# Patient Record
Sex: Male | Born: 1970 | Race: White | Hispanic: No | Marital: Married | Smoking: Never smoker
Health system: Southern US, Community
[De-identification: ages and names within clinical notes are randomized; demographics above are authoritative.]

## PROBLEM LIST (undated history)

## (undated) DIAGNOSIS — I10 Essential (primary) hypertension: Secondary | ICD-10-CM

## (undated) DIAGNOSIS — G4730 Sleep apnea, unspecified: Secondary | ICD-10-CM

## (undated) HISTORY — PX: APPENDECTOMY: SHX54

## (undated) HISTORY — PX: CARDIAC CATHETERIZATION: SHX172

## (undated) HISTORY — PX: KNEE SURGERY: SHX244

---

## 2004-05-18 ENCOUNTER — Emergency Department: Payer: Self-pay | Admitting: Emergency Medicine

## 2004-05-27 ENCOUNTER — Ambulatory Visit: Payer: Self-pay

## 2004-07-06 ENCOUNTER — Encounter: Payer: Self-pay | Admitting: General Practice

## 2004-07-23 ENCOUNTER — Encounter: Payer: Self-pay | Admitting: General Practice

## 2004-08-04 ENCOUNTER — Ambulatory Visit: Payer: Self-pay | Admitting: Orthopaedic Surgery

## 2008-07-15 ENCOUNTER — Emergency Department: Payer: Self-pay | Admitting: Emergency Medicine

## 2008-07-17 ENCOUNTER — Ambulatory Visit: Payer: Self-pay | Admitting: Internal Medicine

## 2009-10-26 ENCOUNTER — Ambulatory Visit: Payer: Self-pay | Admitting: Cardiology

## 2009-11-22 ENCOUNTER — Ambulatory Visit: Payer: Self-pay | Admitting: Cardiology

## 2015-04-26 ENCOUNTER — Encounter: Payer: Self-pay | Admitting: Emergency Medicine

## 2015-04-26 ENCOUNTER — Emergency Department: Payer: BC Managed Care – PPO

## 2015-04-26 ENCOUNTER — Observation Stay
Admission: EM | Admit: 2015-04-26 | Discharge: 2015-04-27 | Disposition: A | Discharge status: Home / Self Care | Payer: BC Managed Care – PPO | Attending: Internal Medicine | Admitting: Internal Medicine

## 2015-04-26 DIAGNOSIS — Z6836 Body mass index (BMI) 36.0-36.9, adult: Secondary | ICD-10-CM | POA: Insufficient documentation

## 2015-04-26 DIAGNOSIS — Z7982 Long term (current) use of aspirin: Secondary | ICD-10-CM | POA: Insufficient documentation

## 2015-04-26 DIAGNOSIS — R0602 Shortness of breath: Secondary | ICD-10-CM | POA: Insufficient documentation

## 2015-04-26 DIAGNOSIS — I10 Essential (primary) hypertension: Secondary | ICD-10-CM | POA: Diagnosis not present

## 2015-04-26 DIAGNOSIS — R079 Chest pain, unspecified: Principal | ICD-10-CM | POA: Diagnosis present

## 2015-04-26 DIAGNOSIS — Z9889 Other specified postprocedural states: Secondary | ICD-10-CM | POA: Diagnosis not present

## 2015-04-26 DIAGNOSIS — R112 Nausea with vomiting, unspecified: Secondary | ICD-10-CM | POA: Diagnosis not present

## 2015-04-26 HISTORY — DX: Essential (primary) hypertension: I10

## 2015-04-26 LAB — BASIC METABOLIC PANEL
ANION GAP: 10 (ref 5–15)
BUN: 17 mg/dL (ref 6–20)
CALCIUM: 9.9 mg/dL (ref 8.9–10.3)
CO2: 27 mmol/L (ref 22–32)
Chloride: 102 mmol/L (ref 101–111)
Creatinine, Ser: 1.08 mg/dL (ref 0.61–1.24)
GFR calc Af Amer: >60 mL/min (ref >60)
GFR calc non Af Amer: >60 mL/min (ref >60)
GLUCOSE: 100 mg/dL — AB (ref 65–99)
Potassium: 4.3 mmol/L (ref 3.5–5.1)
Sodium: 139 mmol/L (ref 135–145)

## 2015-04-26 LAB — CBC
HCT: 45.2 % (ref 40.0–52.0)
HEMOGLOBIN: 15.3 g/dL (ref 13.0–18.0)
MCH: 29.4 pg (ref 26.0–34.0)
MCHC: 33.7 g/dL (ref 32.0–36.0)
MCV: 87.3 fL (ref 80.0–100.0)
Platelets: 239 10*3/uL (ref 150–440)
RBC: 5.18 MIL/uL (ref 4.40–5.90)
RDW: 13.7 % (ref 11.5–14.5)
WBC: 12.3 10*3/uL — ABNORMAL HIGH (ref 3.8–10.6)

## 2015-04-26 LAB — TROPONIN I
Troponin I: <0.03 ng/mL (ref <0.031)
Troponin I: <0.03 ng/mL (ref <0.031)

## 2015-04-26 MED ORDER — LISINOPRIL 10 MG PO TABS
10.0000 mg | ORAL_TABLET | Freq: Every day | ORAL | Status: DC
  Administered 2015-04-27: 10 mg via ORAL
  Filled 2015-04-26: qty 1

## 2015-04-26 MED ORDER — METOPROLOL TARTRATE 25 MG PO TABS
25.0000 mg | ORAL_TABLET | Freq: Two times a day (BID) | ORAL | Status: DC
  Administered 2015-04-26 – 2015-04-27 (×2): 25 mg via ORAL
  Filled 2015-04-26 (×2): qty 1

## 2015-04-26 MED ORDER — ACETAMINOPHEN 650 MG RE SUPP: 650.0000 mg | Freq: Four times a day (QID) | RECTAL | Status: DC | PRN

## 2015-04-26 MED ORDER — ENOXAPARIN SODIUM 40 MG/0.4ML ~~LOC~~ SOLN
40.0000 mg | SUBCUTANEOUS | Status: DC
  Administered 2015-04-26: 40 mg via SUBCUTANEOUS
  Filled 2015-04-26 (×2): qty 0.4

## 2015-04-26 MED ORDER — NITROGLYCERIN 2 % TD OINT
0.5000 [in_us] | TOPICAL_OINTMENT | TRANSDERMAL | Status: AC
  Administered 2015-04-26: 0.5 [in_us] via TOPICAL
  Filled 2015-04-26: qty 1

## 2015-04-26 MED ORDER — SODIUM CHLORIDE 0.9 % IJ SOLN: 3.0000 mL | INTRAMUSCULAR | Status: DC | PRN

## 2015-04-26 MED ORDER — SODIUM CHLORIDE 0.9 % IJ SOLN
3.0000 mL | Freq: Two times a day (BID) | INTRAMUSCULAR | Status: DC
  Administered 2015-04-26: 3 mL via INTRAVENOUS

## 2015-04-26 MED ORDER — ONDANSETRON HCL 4 MG PO TABS: 4.0000 mg | ORAL_TABLET | Freq: Four times a day (QID) | ORAL | Status: DC | PRN

## 2015-04-26 MED ORDER — ONDANSETRON HCL 4 MG/2ML IJ SOLN: 4.0000 mg | Freq: Four times a day (QID) | INTRAMUSCULAR | Status: DC | PRN

## 2015-04-26 MED ORDER — MORPHINE SULFATE (PF) 2 MG/ML IV SOLN: 1.0000 mg | INTRAVENOUS | Status: DC | PRN

## 2015-04-26 MED ORDER — ASPIRIN 81 MG PO CHEW
81.0000 mg | CHEWABLE_TABLET | Freq: Every day | ORAL | Status: DC
  Administered 2015-04-27: 81 mg via ORAL
  Filled 2015-04-26: qty 1

## 2015-04-26 MED ORDER — ACETAMINOPHEN 500 MG PO TABS
1000.0000 mg | ORAL_TABLET | ORAL | Status: AC
  Administered 2015-04-26: 1000 mg via ORAL
  Filled 2015-04-26: qty 2

## 2015-04-26 MED ORDER — ACETAMINOPHEN 325 MG PO TABS
650.0000 mg | ORAL_TABLET | Freq: Four times a day (QID) | ORAL | Status: DC | PRN
  Administered 2015-04-26 (×2): 650 mg via ORAL
  Filled 2015-04-26 (×2): qty 2

## 2015-04-26 MED ORDER — SODIUM CHLORIDE 0.9 % IV SOLN: 250.0000 mL | INTRAVENOUS | Status: DC | PRN

## 2015-04-26 NOTE — ED Notes (Addendum)
Pt arrived via EMS for complaints of chest pain. Pt rates pain 6/10. Pt states pain starts at right nipple and radiates to left side. Pt reports nausea and vomiting. EMS administered ASA 324 mg and nitro x1 SL. EMS 145/102, sinus tach 112, CBG 106. Pt alert, oriented, NAD noted.

## 2015-04-26 NOTE — ED Provider Notes (Signed)
[facility] Emergency Department Provider Note REMINDER - THIS NOTE IS NOT A FINAL MEDICAL RECORD UNTIL IT IS SIGNED. UNTIL THEN, THE CONTENT BELOW MAY REFLECT INFORMATION FROM A DOCUMENTATION TEMPLATE, NOT THE ACTUAL PATIENT VISIT. ____________________________________________  Time seen: Approximately 1:11 PM  I have reviewed the triage vital signs and the nursing notes.   HISTORY  Chief Complaint Chest Pain    HPI Nathan Hayes is a 45 y.o. male the previous history of coronary disease, presents for evaluation of chest pain as discharged prior to arrival. Reports he was driving the car when he had sudden onset of severe pressure in his chest with associated nausea and sweating. The central and folate is tightening. He received aspirin and nitroglycerin and reports his symptoms went away. He just relates a very minimal discomfort in the mid chest at this time.  Denies abdominal pain, shortness of breath or wheezing. Pain was not moving, ripping or tearing in nature.   Past Medical History  Diagnosis Date  . Hypertension     There are no active problems to display for this patient.   Past Surgical History  Procedure Laterality Date  . Cardiac catheterization      No current outpatient prescriptions on file. Strong family history with dad having a major heart attack in his 30s Allergies Review of patient's allergies indicates no known allergies.  No family history on file.  Social History Social History  Substance Use Topics  . Smoking status: Never Smoker   . Smokeless tobacco: Never Used  . Alcohol Use: Yes     Comment: occasional    Review of Systems Constitutional: No fever/chills Eyes: No visual changes. ENT: No sore throat. Cardiovascular: See history of present illness  Respiratory: Denies shortness of breath. Gastrointestinal: No abdominal pain.  no vomiting.  No diarrhea.  No constipation. Genitourinary: Negative for  dysuria. Musculoskeletal: Negative for back pain. Skin: Negative for rash. Neurological: Negative for headaches, focal weakness or numbness.  10-point ROS otherwise negative.  ____________________________________________   PHYSICAL EXAM:  VITAL SIGNS: ED Triage Vitals  Enc Vitals Group     BP 04/26/15 1138 135/86 mmHg     Pulse Rate 04/26/15 1200 80     Resp 04/26/15 1138 13     Temp 04/26/15 1138 98 F (36.7 C)     Temp Source 04/26/15 1138 Oral     SpO2 04/26/15 1138 96 %     Weight 04/26/15 1138 250 lb (113.399 kg)     Height 04/26/15 1138 6' (1.829 m)     Head Cir --      Peak Flow --      Pain Score 04/26/15 1136 3     Pain Loc --      Pain Edu? --      Excl. in GC? --    Constitutional: Alert and oriented. Well appearing and in no acute distress. Eyes: Conjunctivae are normal. PERRL. EOMI. Head: Atraumatic. Nose: No congestion/rhinnorhea. Mouth/Throat: Mucous membranes are moist.  Oropharynx non-erythematous. Neck: No stridor.   Cardiovascular: Normal rate, regular rhythm. Grossly normal heart sounds.  Good peripheral circulation. Respiratory: Normal respiratory effort.  No retractions. Lungs CTAB. Gastrointestinal: Soft and nontender. No distention. No abdominal bruits. No CVA tenderness. Musculoskeletal: No lower extremity tenderness nor edema.  No joint effusions. Neurologic:  Normal speech and language. No gross focal neurologic deficits are appreciated. Skin:  Skin is warm, dry and intact. No rash noted. Psychiatric: Mood and affect are normal. Speech and  behavior are normal.  ____________________________________________   LABS (all labs ordered are listed, but only abnormal results are displayed)  Labs Reviewed  BASIC METABOLIC PANEL - Abnormal; Notable for the following:    Glucose, Bld 100 (*)    All other components within normal limits  CBC - Abnormal; Notable for the following:    WBC 12.3 (*)    All other components within normal limits   TROPONIN I   ____________________________________________  EKG  Reviewed and interpreted me me at 11:40 AM Normal sinus rhythm, QTC 4:30 QRS 90 Heart rate 90 Minimal T-wave depressions less than 1 mm in anteroseptal distribution. Concern for the possibility of ischemic abnormality. No evidence of STEMI  ____________________________________________  RADIOLOGY  DG Chest 2 View (Final result) Result time: 04/26/15 12:16:54   Final result by Rad Results In Interface (04/26/15 12:16:54)   Narrative:   CLINICAL DATA: Chest pain starts of right nipple and radiates to left.  EXAM: CHEST 2 VIEW  COMPARISON: None.  FINDINGS: The heart size and mediastinal contours are within normal limits. Both lungs are clear. The visualized skeletal structures are unremarkable.  IMPRESSION: No active cardiopulmonary disease.   Electronically Signed By: Kennith CenterEric Mansell M.D. On: 04/26/2015 12:16    ____________________________________________   PROCEDURES  Procedure(s) performed: None  Critical Care performed: No  ____________________________________________   INITIAL IMPRESSION / ASSESSMENT AND PLAN / ED COURSE  Pertinent labs & imaging results that were available during my care of the patient were reviewed by me and considered in my medical decision making (see chart for details).  Chest pain. No risk factors for pulmonary most. Denies dyspnea. Hypoxia. Concerning for possible coronary disease, pain now relieved after aspirin and nitroglycerin. EKG is abnormal but without ST elevation. Discussed with cardiology Dr. Lady GaryFath who recommends admission and further evaluation for chest pain rule out. No abdominal pain or symptoms. No ripping tearing or moving pain to suggest dissection. Mediastinum normal. ____________________________________________   FINAL CLINICAL IMPRESSION(S) / ED DIAGNOSES  Final diagnoses:  Chest pain with moderate risk for cardiac etiology       Sharyn CreamerMark Quale, MD 04/26/15 1314

## 2015-04-26 NOTE — Progress Notes (Signed)
Nathan Hayes is a 45 y.o. male patient admitted from ED awake, alert - oriented  X 4 - no acute distress noted.  VSS - Blood pressure 140/79, pulse 73, temperature 97.9 F (36.6 C), temperature source Oral, resp. rate 18, height 6' (1.829 m), weight 120.657 kg (266 lb), SpO2 99 %.    IV in place, occlusive dsg intact without redness.  Orientation to room, and floor completed with information packet given to patient/family.  Admission INP armband ID verified with patient/family, and in place.   SR up x 2, fall assessment complete, with patient and family able to verbalize understanding of risk associated with falls, and verbalized understanding to call nsg before up out of bed.  Call light within reach, patient able to voice, and demonstrate understanding.  Skin, clean-dry- intact without evidence of bruising, or skin tears.   No evidence of skin break down noted on exam. Skin assessed with Tanya B.      Will cont to eval and treat per MD orders.  Rudean HaskellDonnisha R Monserat Prestigiacomo, RN 04/26/2015 3:22 PM

## 2015-04-26 NOTE — H&P (Signed)
[facility] [place] [facility]   PATIENT NAME: Nathan DerWalter Hayes    MR#:  [id number]  DATE OF BIRTH:  08-06-1970  DATE OF ADMISSION:  04/26/2015  PRIMARY CARE PHYSICIAN: Dr Maryjane HurterFeldpausch  REQUESTING/REFERRING PHYSICIAN: Dr Fanny BienQuale  CHIEF COMPLAINT:  Chest pain today  HISTORY OF PRESENT ILLNESS:  Nathan DerWalter Hayes  is a 45 y.o. male with a known history of hypertension, obesity comes to the emergency room after he started having some chest discomfort radiating to the back with feelings of nausea and vomiting today at 7:30. Patient's first set of cardiac enzyme is negative. Currently he is chest pain-free. EKG does not show any acute ST elevation or depression. Given his risk factors for coronary artery disease patient is being admitted for further nausea management  PAST MEDICAL HISTORY:   Past Medical History  Diagnosis Date  . Hypertension     PAST SURGICAL HISTOIRY:   Past Surgical History  Procedure Laterality Date  . Cardiac catheterization      SOCIAL HISTORY:   Social History  Substance Use Topics  . Smoking status: Never Smoker   . Smokeless tobacco: Never Used  . Alcohol Use: Yes     Comment: occasional    FAMILY HISTORY:  No family history on file.  DRUG ALLERGIES:  No Known Allergies  REVIEW OF SYSTEMS:  Review of Systems  Constitutional: Negative for fever, chills and weight loss.  HENT: Negative for ear discharge, ear pain and nosebleeds.   Eyes: Negative for blurred vision, pain and discharge.  Respiratory: Negative for sputum production, shortness of breath, wheezing and stridor.   Cardiovascular: Positive for chest pain. Negative for palpitations, orthopnea and PND.  Gastrointestinal: Positive for nausea and vomiting. Negative for abdominal pain and diarrhea.  Genitourinary: Negative for urgency and frequency.  Musculoskeletal: Negative for back pain and joint pain.  Neurological: Negative for sensory change, speech change,  focal weakness and weakness.  Psychiatric/Behavioral: Negative for depression and hallucinations. The patient is not nervous/anxious.   All other systems reviewed and are negative.    MEDICATIONS AT HOME:   Prior to Admission medications   Medication Sig Start Date End Date Taking? Authorizing Provider  aspirin EC 81 MG tablet Take 81-162 mg by mouth daily.   Yes Historical Provider, MD  calcium carbonate (TUMS - DOSED IN MG ELEMENTAL CALCIUM) 500 MG chewable tablet Chew 1 tablet by mouth 3 (three) times daily.   Yes Historical Provider, MD  lisinopril (PRINIVIL,ZESTRIL) 10 MG tablet Take 10 mg by mouth daily.   Yes Historical Provider, MD      VITAL SIGNS:  Blood pressure 140/79, pulse 73, temperature 97.9 F (36.6 C), temperature source Oral, resp. rate 18, height 6' (1.829 m), weight 120.657 kg (266 lb), SpO2 99 %.  PHYSICAL EXAMINATION:  GENERAL:  45 y.o.-year-old patient lying in the bed with no acute distress.  EYES: Pupils equal, round, reactive to light and accommodation. No scleral icterus. Extraocular muscles intact.  HEENT: Head atraumatic, normocephalic. Oropharynx and nasopharynx clear.  NECK:  Supple, no jugular venous distention. No thyroid enlargement, no tenderness.  LUNGS: Normal breath sounds bilaterally, no wheezing, rales,rhonchi or crepitation. No use of accessory muscles of respiration.  CARDIOVASCULAR: S1, S2 normal. No murmurs, rubs, or gallops.  ABDOMEN: Soft, nontender, nondistended. Bowel sounds present. No organomegaly or mass.  EXTREMITIES: No pedal edema, cyanosis, or clubbing.  NEUROLOGIC: Cranial nerves II through XII are intact. Muscle strength 5/5 in all extremities. Sensation intact. Gait not checked.  PSYCHIATRIC: The patient is alert and oriented x 3.  SKIN: No obvious rash, lesion, or ulcer.   LABORATORY PANEL:   CBC  Recent Labs Lab 04/26/15 1145  WBC 12.3*  HGB 15.3  HCT 45.2  PLT 239    ------------------------------------------------------------------------------------------------------------------  Chemistries   Recent Labs Lab 04/26/15 1145  NA 139  K 4.3  CL 102  CO2 27  GLUCOSE 100*  BUN 17  CREATININE 1.08  CALCIUM 9.9   ------------------------------------------------------------------------------------------------------------------  Cardiac Enzymes  Recent Labs Lab 04/26/15 1145  TROPONINI <0.03   ------------------------------------------------------------------------------------------------------------------  RADIOLOGY:  Dg Chest 2 View  04/26/2015  CLINICAL DATA:  Chest pain starts of right nipple and radiates to left. EXAM: CHEST  2 VIEW COMPARISON:  None. FINDINGS: The heart size and mediastinal contours are within normal limits. Both lungs are clear. The visualized skeletal structures are unremarkable. IMPRESSION: No active cardiopulmonary disease. Electronically Signed   By: Kennith Center M.D.   On: 04/26/2015 12:16    EKG:   Normal sinus rhythm IMPRESSION AND PLAN:  Nathan Hayes  is a 45 y.o. male with a known history of hypertension, obesity comes to the emergency room after he started having some chest discomfort radiating to the back with feelings of nausea and vomiting today at 7:30.  1. Chest pain Admit to telemetry Continue aspirin, when necessary nitroglycerin, beta blockers, ACE inhibitor Cycle cardiac enzymes 3 Cardiac consultation with Dr. Carmon Ginsberg ATH  2. Hypertension Continue home medicine that is lisinopril   3. Morbid obesity Diet and exercise discussed   4. DVT prophylaxis subcutaneous Lovenox  All the records are reviewed and case discussed with ED provider. Management plans discussed with the patient, family and they are in agreement.  CODE STATUS: full  TOTAL TIME TAKING CARE OF THIS PATIENT: 45 minutes.    Meldrick Buttery M.D on 04/26/2015 at 2:43 PM  Between 7am to 6pm - Pager - [phone]  After 6pm go to  [email or web address] - password [email or web address] [facility]  [place] [facility]  Office  [phone]  CC: Primary care physician; No primary care provider on file.

## 2015-04-26 NOTE — Consult Note (Signed)
[facility] CARDIOLOGY [facility]  CARDIOLOGY CONSULT NOTE  Patient ID: Nathan Hayes MRN: [id number] DOB/AGE: 46-Nov-1972 45 y.o.  Admit date: 04/26/2015 Referring Physician Dr. Allena Katz Primary Physician   Primary Cardiologist  Dr. Lady Gary Reason for Consultation  Chest pain  HPI:  Patient is a 45 year old male was admitted  after developing midsternal chest pain with radiation to his back down his arm associated with diaphoresis and nausea.  Patient was evaluated in 2011 with left cardiac catheterization after an abnormal functional study.  This revealed normal coronaries.  He is being doing fairly well from a cardiac standpoint until today when he developed bilateral chest pain under both nipples.  It radiated into his back down his arm into his neck with associated nausea vomiting and shortness breath.  He also had some diaphoresis.  EKG reveals sinus rhythm with no ischemia.  Serum troponin thus far are normal.  Chest x-ray is unremarkable.  ROS Review of Systems - History obtained from chart review and the patient General ROS: positive for  - Chest pain, shortness of breath, diaphoresis and nausea Respiratory ROS: positive for - shortness of breath Cardiovascular ROS: positive for - chest pain Gastrointestinal ROS: no abdominal pain, change in bowel habits, or black or bloody stools Musculoskeletal ROS: negative Neurological ROS: no TIA or stroke symptoms   Past Medical History  Diagnosis Date  . Hypertension     History reviewed. No pertinent family history.  Social History   Social History  . Marital Status: Married    Spouse Name: N/A  . Number of Children: N/A  . Years of Education: N/A   Occupational History  . Not on file.   Social History Main Topics  . Smoking status: Never Smoker   . Smokeless tobacco: Never Used  . Alcohol Use: Yes     Comment: occasional  . Drug Use: Not on file  . Sexual Activity: Yes   Other Topics Concern  . Not on file    Social History Narrative  . No narrative on file    Past Surgical History  Procedure Laterality Date  . Cardiac catheterization       Prescriptions prior to admission  Medication Sig Dispense Refill Last Dose  . aspirin EC 81 MG tablet Take 81-162 mg by mouth daily.   04/26/2015 at 0545  . calcium carbonate (TUMS - DOSED IN MG ELEMENTAL CALCIUM) 500 MG chewable tablet Chew 1 tablet by mouth 3 (three) times daily.   04/26/2015 at 0545  . lisinopril (PRINIVIL,ZESTRIL) 10 MG tablet Take 10 mg by mouth daily.   04/26/2015 at 0545    Physical Exam: Blood pressure 140/79, pulse 73, temperature 97.9 F (36.6 C), temperature source Oral, resp. rate 18, height 6' (1.829 m), weight 120.657 kg (266 lb), SpO2 99 %.   General appearance: alert and cooperative Head: Normocephalic, without obvious abnormality, atraumatic Resp: clear to auscultation bilaterally Chest wall: no tenderness Cardio: regular rate and rhythm, S1, S2 normal, no murmur, click, rub or gallop GI: soft, non-tender; bowel sounds normal; no masses,  no organomegaly Extremities: extremities normal, atraumatic, no cyanosis or edema Lymph nodes: Cervical, supraclavicular, and axillary nodes normal. Labs:   Lab Results  Component Value Date   WBC 12.3* 04/26/2015   HGB 15.3 04/26/2015   HCT 45.2 04/26/2015   MCV 87.3 04/26/2015   PLT 239 04/26/2015    Recent Labs Lab 04/26/15 1145  NA 139  K 4.3  CL 102  CO2 27  BUN  17  CREATININE 1.08  CALCIUM 9.9  GLUCOSE 100*   Lab Results  Component Value Date   TROPONINI <0.03 04/26/2015      Radiology:   No acute cardiopulmonary process EKG:  Normal sinus rhythm with no ischemia  ASSESSMENT AND PLAN:   A 45 year old male with history of normal cardiac catheterization in 2011 who was admitted with midsternal and bilateral chest pain radiating into his back and neck and arm.  Pain with both typical atypical features.  EKG shows no ischemia.  He is ruled out from myocardia  infarction thus far.  Will continue to rule amount.  Should he remain without active ischemia as evidence by EKG changes or cardiac marker changes would proceed with a functional study to further evaluate possible ischemia.  Further recommendations will based on the results of this.  Would continue with current medical therapy. Signed: Dalia HeadingFATH,Ayleah Hofmeister A. MD, [facility] 04/26/2015, 8:22 PM

## 2015-04-27 ENCOUNTER — Observation Stay: Payer: BC Managed Care – PPO

## 2015-04-27 ENCOUNTER — Encounter: Payer: Self-pay | Admitting: Radiology

## 2015-04-27 LAB — NM MYOCAR MULTI W/SPECT W/WALL MOTION / EF
CHL CUP NUCLEAR SRS: 0
CHL CUP NUCLEAR SSS: 1
CHL CUP RESTING HR STRESS: 72 {beats}/min
CSEPED: 8 min
CSEPPHR: 166 {beats}/min
Estimated workload: 10.1 METS
Exercise duration (sec): 17 s
LV dias vol: 63 mL
LV sys vol: 26 mL
MPHR: 175 {beats}/min
Percent HR: 94 %
SDS: 1
TID: 0.64

## 2015-04-27 MED ORDER — TECHNETIUM TC 99M SESTAMIBI - CARDIOLITE
32.5600 | Freq: Once | INTRAVENOUS | Status: AC | PRN
  Administered 2015-04-27: 32.56 via INTRAVENOUS

## 2015-04-27 MED ORDER — METOPROLOL TARTRATE 25 MG PO TABS: 12.5000 mg | ORAL_TABLET | Freq: Two times a day (BID) | ORAL | Status: AC

## 2015-04-27 MED ORDER — TECHNETIUM TC 99M SESTAMIBI - CARDIOLITE
13.0000 | Freq: Once | INTRAVENOUS | Status: AC | PRN
  Administered 2015-04-27: 11:00:00 13.75 via INTRAVENOUS

## 2015-04-27 NOTE — Discharge Instructions (Signed)
Activity as tolerated  Diet heart healthy Follow-up with primary care physician in a week Follow-up with cardiology Dr. Lady GaryFath  On 05/11/2015 at 10:30 AM

## 2015-04-27 NOTE — Discharge Summary (Signed)
[facility] [place] [facility]   PATIENT NAME: Nathan Hayes    MR#:  [id number]  DATE OF BIRTH:  06-10-1970  DATE OF ADMISSION:  04/26/2015 ADMITTING PHYSICIAN: Enedina FinnerSona Patel, MD  DATE OF DISCHARGE: 04/27/2015 PRIMARY CARE PHYSICIAN: No primary care provider on file.    ADMISSION DIAGNOSIS:  Chest pain with moderate risk for cardiac etiology [R07.9]  DISCHARGE DIAGNOSIS:  Active Problems:   Chest pain   SECONDARY DIAGNOSIS:   Past Medical History  Diagnosis Date  . Hypertension     HOSPITAL COURSE:   Nathan DerWalter Underdown is a 45 y.o. male with a known history of hypertension, obesity comes to the emergency room after he started having some chest discomfort radiating to the back with feelings of nausea and vomiting today at 7:30.  1. Chest pain-resolved Monitor patient on telemetry Continue aspirin, when necessary nitroglycerin, beta blockers, ACE inhibitor Cycle cardiac enzymes 3 non-trending- Appreciate cardiology recommendations, patient had stress test which is normal Outpatient follow-up with cardiology on January 17 at 10:30 AM as scheduled  2. Hypertension Continue home medicine that is lisinopril . Metoprolol 12.5 mg by mouth twice a day is added to the regimen  3. Morbid obesity Diet and exercise discussed   4. DVT prophylaxis subcutaneous Lovenox  DISCHARGE CONDITIONS:   fair  CONSULTS OBTAINED:  Treatment Team:  Dalia HeadingKenneth A Fath, MD   PROCEDURES stress test-normal  DRUG ALLERGIES:  No Known Allergies  DISCHARGE MEDICATIONS:   Current Discharge Medication List    START taking these medications   Details  metoprolol tartrate (LOPRESSOR) 25 MG tablet Take 0.5 tablets (12.5 mg total) by mouth 2 (two) times daily. Qty: 30 tablet, Refills: 0      CONTINUE these medications which have NOT CHANGED   Details  aspirin EC 81 MG tablet Take 81-162 mg by mouth daily.    calcium carbonate (TUMS - DOSED IN MG ELEMENTAL CALCIUM)  500 MG chewable tablet Chew 1 tablet by mouth 3 (three) times daily.    lisinopril (PRINIVIL,ZESTRIL) 10 MG tablet Take 10 mg by mouth daily.         DISCHARGE INSTRUCTIONS:   Activity as tolerated  Diet heart healthy Follow-up with primary care physician in a week Follow-up with cardiology Dr. Lady GaryFath  On 05/11/2015 at 10:30 AM  DIET:  Heart healthy  DISCHARGE CONDITION:  Fair  ACTIVITY:  Activity as tolerated  OXYGEN:  Home Oxygen: No.   Oxygen Delivery: room air  DISCHARGE LOCATION:  home   If you experience worsening of your admission symptoms, develop shortness of breath, life threatening emergency, suicidal or homicidal thoughts you must seek medical attention immediately by calling 911 or calling your MD immediately  if symptoms less severe.  You Must read complete instructions/literature along with all the possible adverse reactions/side effects for all the Medicines you take and that have been prescribed to you. Take any new Medicines after you have completely understood and accpet all the possible adverse reactions/side effects.   Please note  You were cared for by a hospitalist during your hospital stay. If you have any questions about your discharge medications or the care you received while you were in the hospital after you are discharged, you can call the unit and asked to speak with the hospitalist on call if the hospitalist that took care of you is not available. Once you are discharged, your primary care physician will handle any further medical issues. Please note that NO REFILLS for  any discharge medications will be authorized once you are discharged, as it is imperative that you return to your primary care physician (or establish a relationship with a primary care physician if you do not have one) for your aftercare needs so that they can reassess your need for medications and monitor your lab values.     Today  Chief Complaint  Patient presents with  .  Chest Pain   Patient is feeling fine. Denies any chest pain or shortness of breath. Had stress test which was normal. Okay to be discharged from cardiology standpoint  ROS:  CONSTITUTIONAL: Denies fevers, chills. Denies any fatigue, weakness.  EYES: Denies blurry vision, double vision, eye pain. EARS, NOSE, THROAT: Denies tinnitus, ear pain, hearing loss. RESPIRATORY: Denies cough, wheeze, shortness of breath.  CARDIOVASCULAR: Denies chest pain, palpitations, edema.  GASTROINTESTINAL: Denies nausea, vomiting, diarrhea, abdominal pain. Denies bright red blood per rectum. GENITOURINARY: Denies dysuria, hematuria. ENDOCRINE: Denies nocturia or thyroid problems. HEMATOLOGIC AND LYMPHATIC: Denies easy bruising or bleeding. SKIN: Denies rash or lesion. MUSCULOSKELETAL: Denies pain in neck, back, shoulder, knees, hips or arthritic symptoms.  NEUROLOGIC: Denies paralysis, paresthesias.  PSYCHIATRIC: Denies anxiety or depressive symptoms.   VITAL SIGNS:  Blood pressure 130/65, pulse 89, temperature 98 F (36.7 C), temperature source Oral, resp. rate 18, height 6' (1.829 m), weight 120.657 kg (266 lb), SpO2 97 %.  I/O:   Intake/Output Summary (Last 24 hours) at 04/27/15 1449 Last data filed at 04/26/15 1900  Gross per 24 hour  Intake    240 ml  Output      0 ml  Net    240 ml    PHYSICAL EXAMINATION:  GENERAL:  45 y.o.-year-old patient lying in the bed with no acute distress.  EYES: Pupils equal, round, reactive to light and accommodation. No scleral icterus. Extraocular muscles intact.  HEENT: Head atraumatic, normocephalic. Oropharynx and nasopharynx clear.  NECK:  Supple, no jugular venous distention. No thyroid enlargement, no tenderness.  LUNGS: Normal breath sounds bilaterally, no wheezing, rales,rhonchi or crepitation. No use of accessory muscles of respiration.  CARDIOVASCULAR: S1, S2 normal. No murmurs, rubs, or gallops.  ABDOMEN: Soft, non-tender, non-distended. Bowel sounds  present. No organomegaly or mass.  EXTREMITIES: No pedal edema, cyanosis, or clubbing.  NEUROLOGIC: Cranial nerves II through XII are intact. Muscle strength 5/5 in all extremities. Sensation intact. Gait not checked.  PSYCHIATRIC: The patient is alert and oriented x 3.  SKIN: No obvious rash, lesion, or ulcer.   DATA REVIEW:   CBC  Recent Labs Lab 04/26/15 1145  WBC 12.3*  HGB 15.3  HCT 45.2  PLT 239    Chemistries   Recent Labs Lab 04/26/15 1145  NA 139  K 4.3  CL 102  CO2 27  GLUCOSE 100*  BUN 17  CREATININE 1.08  CALCIUM 9.9    Cardiac Enzymes  Recent Labs Lab 04/26/15 2028  TROPONINI <0.03    Microbiology Results  No results found for this or any previous visit.  RADIOLOGY:  Dg Chest 2 View  04/26/2015  CLINICAL DATA:  Chest pain starts of right nipple and radiates to left. EXAM: CHEST  2 VIEW COMPARISON:  None. FINDINGS: The heart size and mediastinal contours are within normal limits. Both lungs are clear. The visualized skeletal structures are unremarkable. IMPRESSION: No active cardiopulmonary disease. Electronically Signed   By: Kennith Center M.D.   On: 04/26/2015 12:16   Nm Myocar Multi W/spect W/wall Motion / Ef  04/27/2015  The study is normal.  This is a low risk study.  The left ventricular ejection fraction is normal (55-65%).     EKG:   Orders placed or performed during the hospital encounter of 04/26/15  . EKG 12-Lead  . EKG 12-Lead  . ED EKG within 10 minutes  . ED EKG within 10 minutes  . ED EKG  . ED EKG      Management plans discussed with the patient, family and they are in agreement.  CODE STATUS:     Code Status Orders        Start     Ordered   04/26/15 1417  Full code   Continuous     04/26/15 1416      TOTAL TIME TAKING CARE OF THIS PATIENT: 45  minutes.    @[facility] @  on 04/27/2015 at 2:49 PM  Between 7am to 6pm - Pager - [phone]  After 6pm go to [email or web address] - password [email or web address] [facility]  [place]  [facility]  Office  [phone]  CC: Primary care physician; No primary care provider on file.

## 2015-04-27 NOTE — Progress Notes (Signed)
[facility]  SUBJECTIVE: No further chest pain. Ruled out for mi.   Filed Vitals:   04/26/15 2045 04/27/15 0539 04/27/15 0823 04/27/15 1243  BP: 108/54 119/66 128/51 130/65  Pulse: 62 70 71 89  Temp: 97.6 F (36.4 C) 97.9 F (36.6 C) 97.7 F (36.5 C) 98 F (36.7 C)  TempSrc: Oral Oral Oral Oral  Resp: 16 16 18 18   Height:      Weight:      SpO2: 97% 99% 99% 97%    Intake/Output Summary (Last 24 hours) at 04/27/15 1337 Last data filed at 04/26/15 1900  Gross per 24 hour  Intake    240 ml  Output      0 ml  Net    240 ml    LABS: Basic Metabolic Panel:  Recent Labs  16/01/9600/02/17 1145  NA 139  K 4.3  CL 102  CO2 27  GLUCOSE 100*  BUN 17  CREATININE 1.08  CALCIUM 9.9   Liver Function Tests: No results for input(s): AST, ALT, ALKPHOS, BILITOT, PROT, ALBUMIN in the last 72 hours. No results for input(s): LIPASE, AMYLASE in the last 72 hours. CBC:  Recent Labs  04/26/15 1145  WBC 12.3*  HGB 15.3  HCT 45.2  MCV 87.3  PLT 239   Cardiac Enzymes:  Recent Labs  04/26/15 1145 04/26/15 1432 04/26/15 2028  TROPONINI <0.03 <0.03 <0.03   BNP: Invalid input(s): POCBNP D-Dimer: No results for input(s): DDIMER in the last 72 hours. Hemoglobin A1C: No results for input(s): HGBA1C in the last 72 hours. Fasting Lipid Panel: No results for input(s): CHOL, HDL, LDLCALC, TRIG, CHOLHDL, LDLDIRECT in the last 72 hours. Thyroid Function Tests: No results for input(s): TSH, T4TOTAL, T3FREE, THYROIDAB in the last 72 hours.  Invalid input(s): FREET3 Anemia Panel: No results for input(s): VITAMINB12, FOLATE, FERRITIN, TIBC, IRON, RETICCTPCT in the last 72 hours.   Physical Exam: Blood pressure 130/65, pulse 89, temperature 98 F (36.7 C), temperature source Oral, resp. rate 18, height 6' (1.829 m), weight 120.657 kg (266 lb), SpO2 97 %.  General appearance: alert and cooperative Resp: clear to auscultation bilaterally Cardio:  regular rate and rhythm GI: soft, non-tender; bowel sounds normal; no masses,  no organomegaly Extremities: extremities normal, atraumatic, no cyanosis or edema Neurologic: Grossly normal  TELEMETRY: Reviewed telemetry pt in nsr:  ASSESSMENT AND PLAN:  Active Problems:   Chest pain-chest pain with both typical and atypical features. No ekg changes c/w ischemia. Functional study showed normal lv function and no evidence of ischemia with excellent exercise tolerance. OK for discharge from cardiac standpoint with outpatient follow up in 1 week to discuss any further work up depending on symptoms.     Dalia HeadingFATH,Sheila Ocasio A., MD, [facility] 04/27/2015 1:37 PM

## 2016-09-18 IMAGING — CR DG CHEST 2V
2 series · 2 of 2 positions shown · non-contrast
Comparison: None.

CLINICAL DATA: Chest pain starts of right nipple and radiates to
left.

EXAM:
CHEST  2 VIEW

[chest pa]
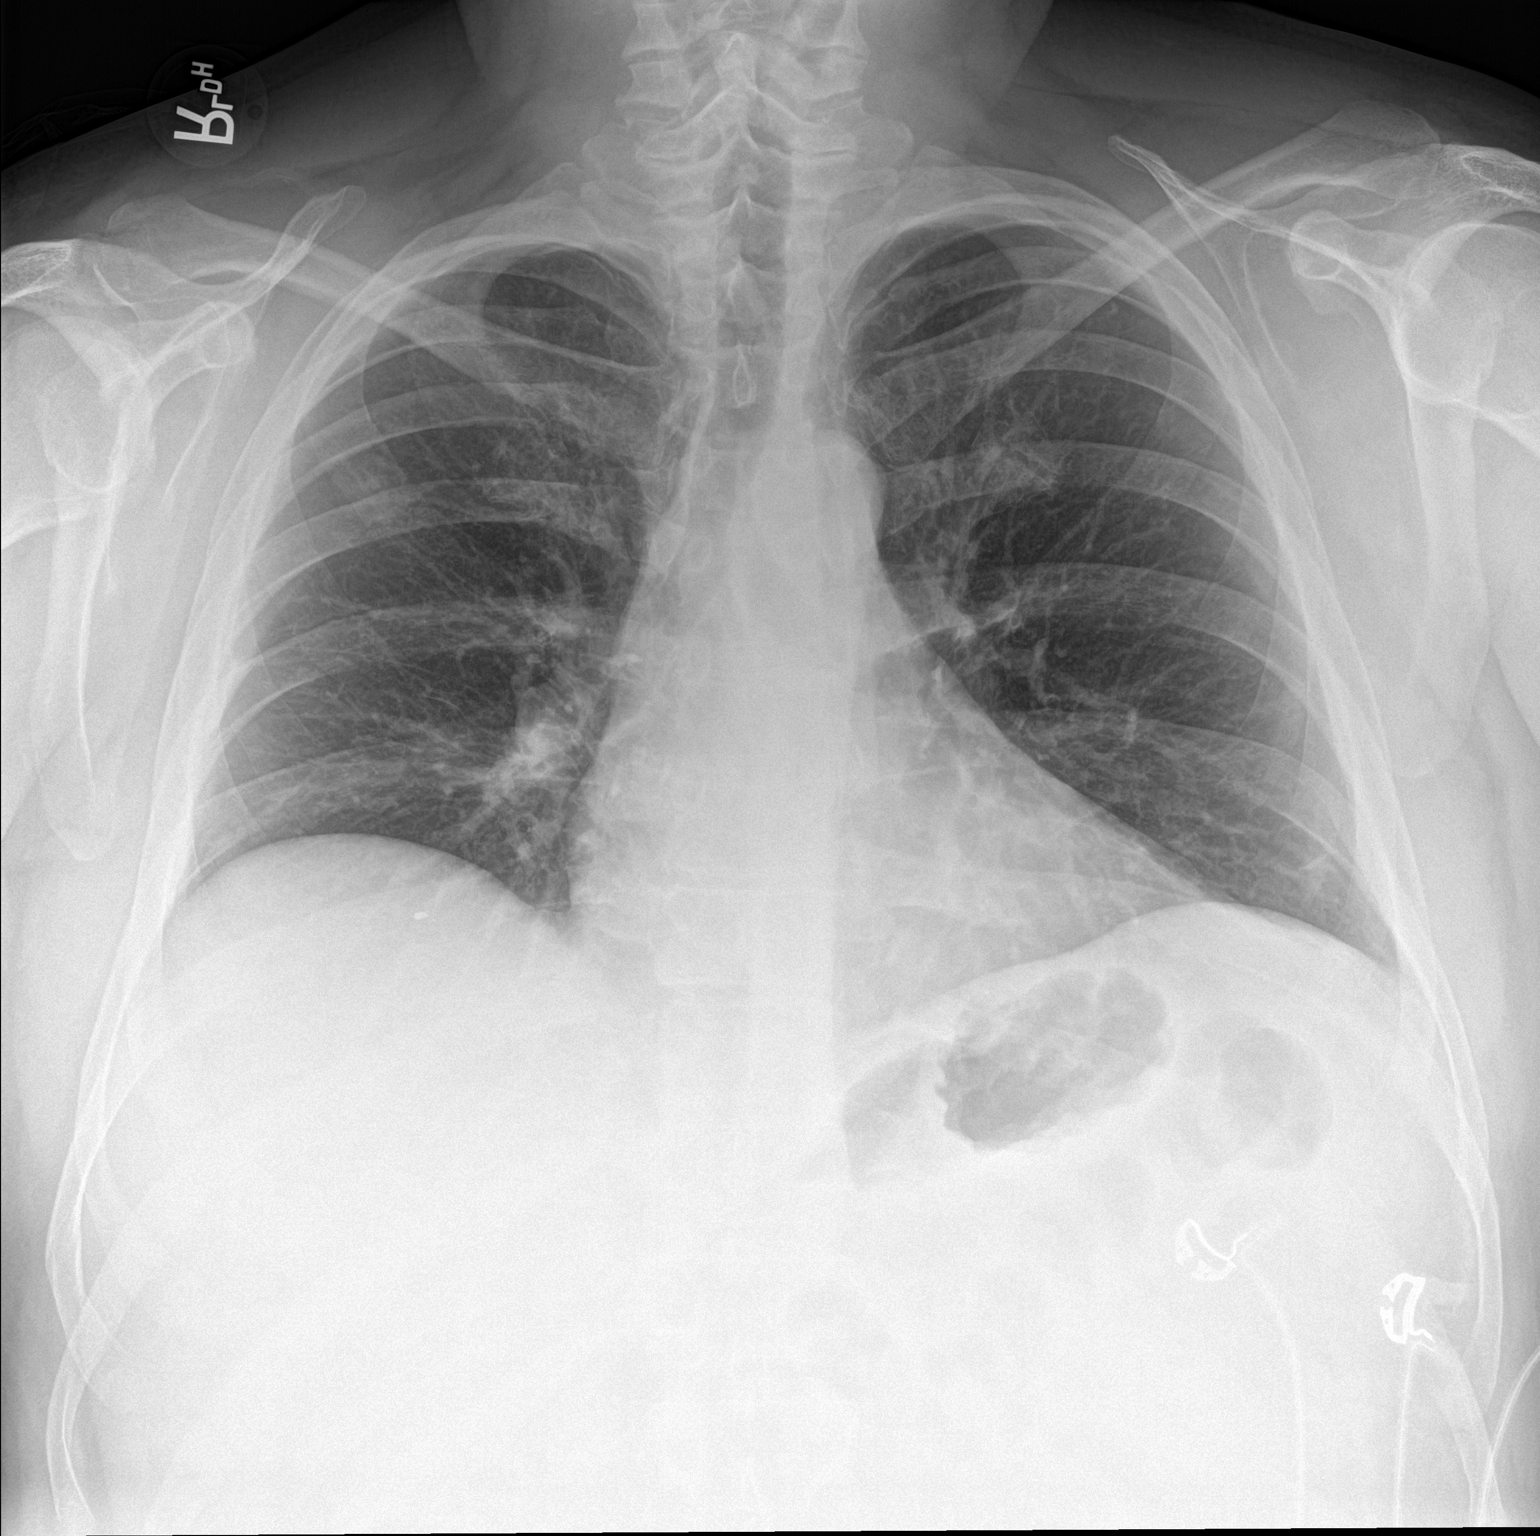

[chest lat]
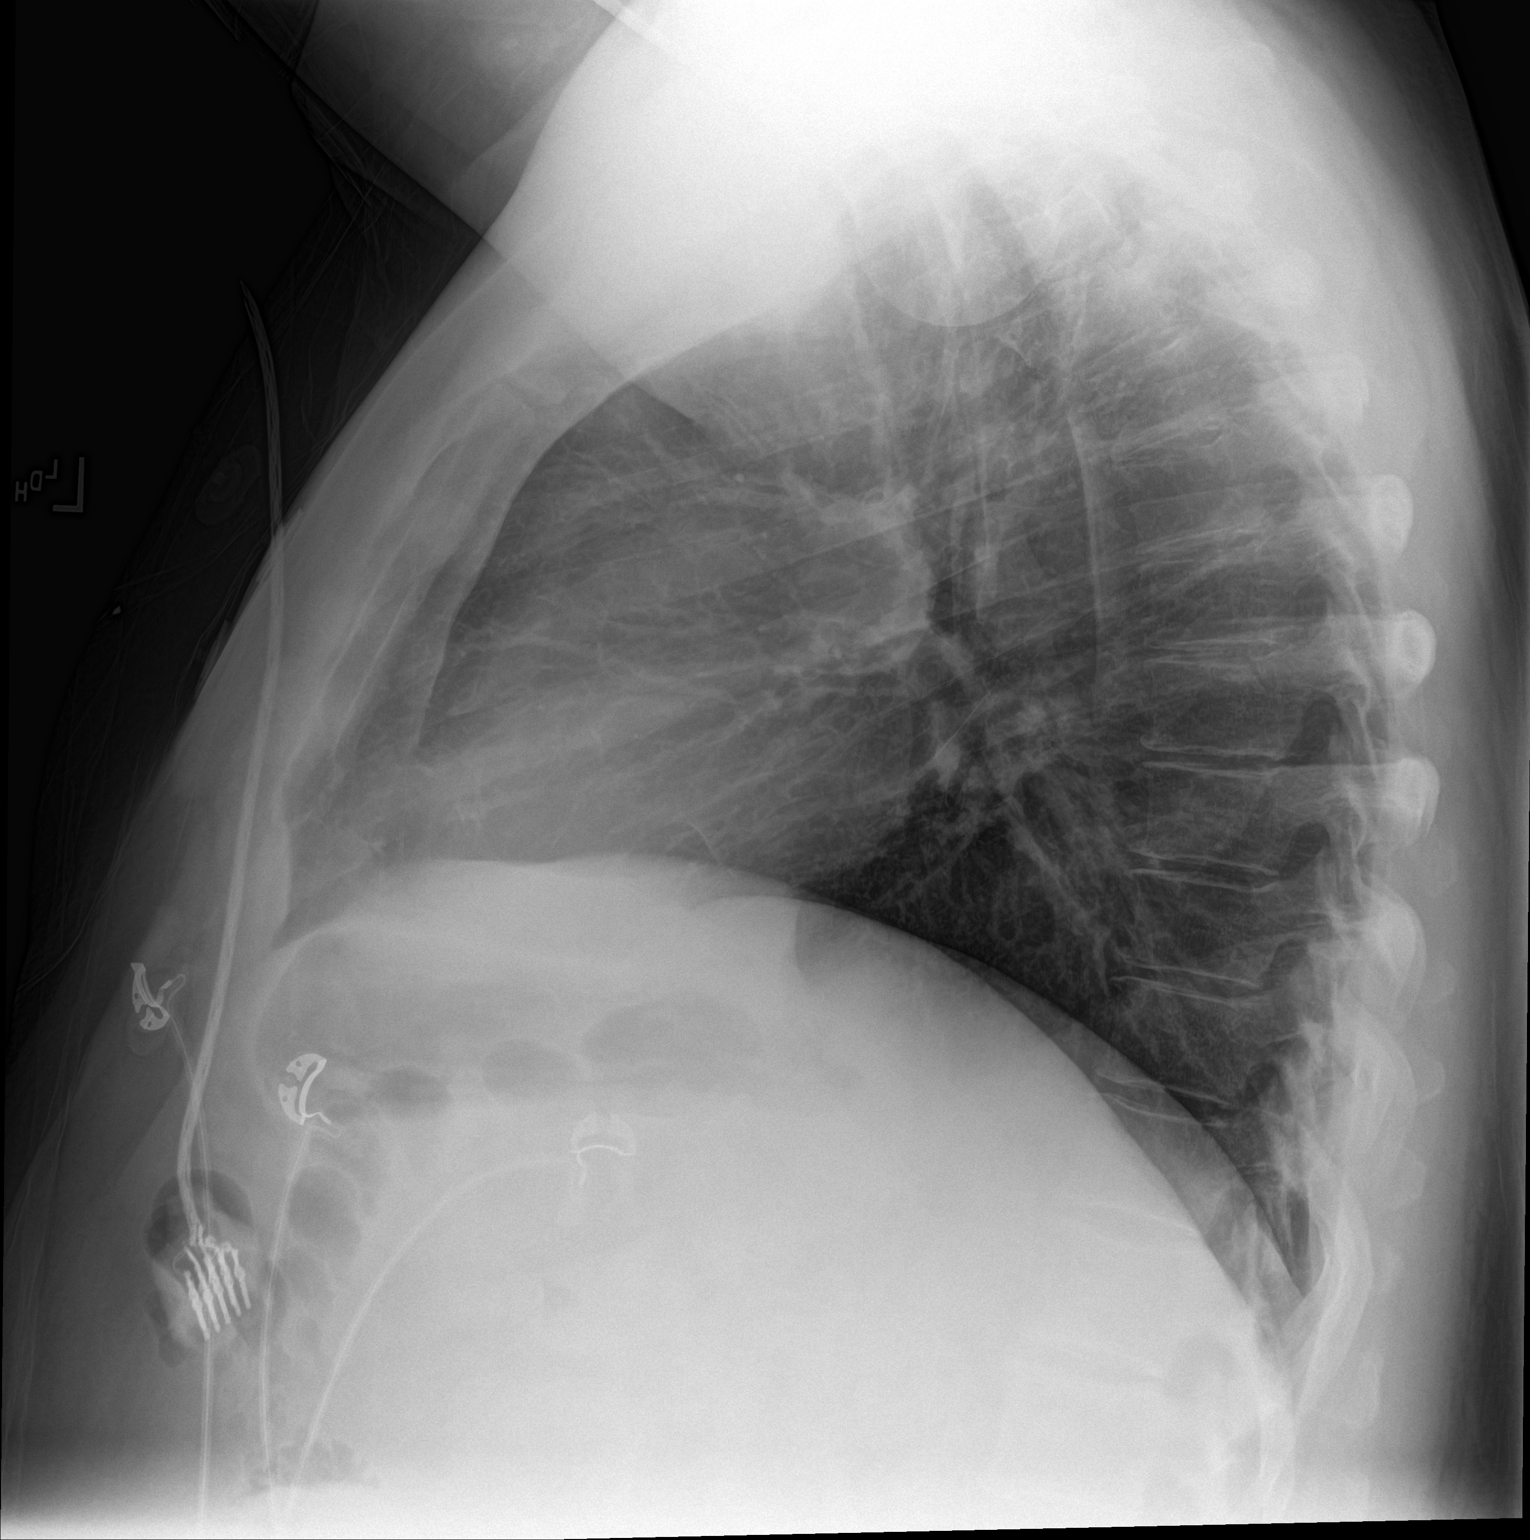

[2 of 2 positions shown; findings below may reference images not displayed]

FINDINGS: The heart size and mediastinal contours are within normal limits.
Both lungs are clear. The visualized skeletal structures are
unremarkable.
IMPRESSION: No active cardiopulmonary disease.

## 2021-03-03 ENCOUNTER — Telehealth: Payer: Self-pay | Admitting: Emergency Medicine

## 2021-03-03 ENCOUNTER — Other Ambulatory Visit: Payer: Self-pay

## 2021-03-03 ENCOUNTER — Ambulatory Visit
Admission: EM | Admit: 2021-03-03 | Discharge: 2021-03-03 | Disposition: A | Discharge status: Home / Self Care | Payer: BC Managed Care – PPO | Attending: Emergency Medicine | Admitting: Emergency Medicine

## 2021-03-03 DIAGNOSIS — H109 Unspecified conjunctivitis: Secondary | ICD-10-CM

## 2021-03-03 DIAGNOSIS — B9689 Other specified bacterial agents as the cause of diseases classified elsewhere: Secondary | ICD-10-CM | POA: Diagnosis not present

## 2021-03-03 MED ORDER — POLYMYXIN B-TRIMETHOPRIM 10000-0.1 UNIT/ML-% OP SOLN: 1.0000 [drp] | OPHTHALMIC | 0 refills | Status: AC

## 2021-03-03 NOTE — ED Triage Notes (Signed)
Pt here with C/O bilateral eye redness, discharge. Family has had pink eye

## 2021-03-03 NOTE — ED Provider Notes (Signed)
[facility]    CSN: [id number] Arrival date & time: 03/03/21  0847      History   Chief Complaint Chief Complaint  Patient presents with  . Eye Problem    HPI Nathan Hayes is a 50 y.o. male.   Patient presents with bilateral eye discharge, blurred vision and redness for 2 to 3 days.  Has attempted use of over-the-counter eyedrops with no relief.  Other members of household have [place].  History of hypertension.   Past Medical History:  Diagnosis Date  . Hypertension     Patient Active Problem List   Diagnosis Date Noted  . Chest pain 04/26/2015    Past Surgical History:  Procedure Laterality Date  . CARDIAC CATHETERIZATION    . KNEE SURGERY Right    Orthoscopic Rt knee surgery 2005-2006 approximately per pt       Home Medications    Prior to Admission medications   Medication Sig Start Date End Date Taking? Authorizing Provider  aspirin EC 81 MG tablet Take 81-162 mg by mouth daily.   Yes [provider]  calcium carbonate (TUMS - DOSED IN MG ELEMENTAL CALCIUM) 500 MG chewable tablet Chew 1 tablet by mouth 3 (three) times daily.   Yes [provider]  lisinopril (PRINIVIL,ZESTRIL) 10 MG tablet Take 10 mg by mouth daily.   Yes [provider]  metoprolol tartrate (LOPRESSOR) 25 MG tablet Take 0.5 tablets (12.5 mg total) by mouth 2 (two) times daily. 04/27/15  Yes Nicholes Mango, MD    Family History History reviewed. No pertinent family history.  Social History Social History   Tobacco Use  . Smoking status: Never  . Smokeless tobacco: Never  Substance Use Topics  . Alcohol use: Yes    Comment: occasional  . Drug use: No     Allergies   Patient has no known allergies.   Review of Systems Review of Systems  Constitutional: Negative.   Eyes:  Positive for discharge, redness and visual disturbance. Negative for photophobia, pain and itching.  Respiratory: Negative.    Cardiovascular: Negative.   Skin:  Negative.   Neurological: Negative.     Physical Exam Triage Vital Signs ED Triage Vitals  Enc Vitals Group     BP 03/03/21 0926 138/69     Pulse Rate 03/03/21 0926 79     Resp 03/03/21 0926 18     Temp 03/03/21 0926 99.7 F (37.6 C)     Temp Source 03/03/21 0926 Oral     SpO2 03/03/21 0926 96 %     Weight 03/03/21 0925 260 lb (117.9 kg)     Height 03/03/21 0925 6' (1.829 m)     Head Circumference --      Peak Flow --      Pain Score 03/03/21 0925 0     Pain Loc --      Pain Edu? --      Excl. in [place]? --    No data found.  Updated Vital Signs BP 138/69 (BP Location: Left Arm)   Pulse 79   Temp 99.7 F (37.6 C) (Oral)   Resp 18   Ht 6' (1.829 m)   Wt 260 lb (117.9 kg)   SpO2 96%   BMI 35.26 kg/m   Visual Acuity Right Eye Distance:   Left Eye Distance:   Bilateral Distance:    Right Eye Near:   Left Eye Near:    Bilateral Near:     Physical Exam Constitutional:  Appearance: Normal appearance. He is normal weight.  HENT:     Head: Normocephalic.  Eyes:     Comments: Dried drainage on the bilateral eyelashes of the upper and lower lid, diffuse erythema throughout the bilateral sclera, extraocular movement is intact, visual acuity intact  Neurological:     Mental Status: He is alert.     UC Treatments / Results  Labs (all labs ordered are listed, but only abnormal results are displayed) Labs Reviewed - No data to display  EKG   Radiology No results found.  Procedures Procedures (including critical care time)  Medications Ordered in UC Medications - No data to display  Initial Impression / Assessment and Plan / UC Course  I have reviewed the triage vital signs and the nursing notes.  Pertinent labs & imaging results that were available during my care of the patient were reviewed by me and considered in my medical decision making (see chart for details).  Bilateral bacterial conjunctivitis  1.  Polytrim 1 drop each eye every 4 hours for 7  days 2.  Cool compresses for comfort and to remove drainage 3.  Over-the-counter antihistamine as needed for itching 4.  Follow-up with urgent care for ophthalmology for persistent or reoccurring symptoms Final Clinical Impressions(s) / UC Diagnoses   Final diagnoses:  None   Discharge Instructions   None    ED Prescriptions   None    PDMP not reviewed this encounter.   Valinda Hoar, NP 03/03/21 1319

## 2021-03-03 NOTE — Discharge Instructions (Signed)
Today you being treated for bacterial conjunctivitis.   Place one drop of polytrim into the effected eye every 4 hours while awake for 7 days. If the other eye starts to have symptoms you may use medication in it as well. Do not allow tip of dropper to touch eye. May use cool compress for comfort and to remove discharge if present. Pat the eye, do not wipe.  If wearing contacts, dispose of current pair. Wear glasses until symptoms have resolved.   Do not rub eyes, this may cause more irritation.  May use zyrtec, Claritin or benadryl as needed to help if itching present.  If symptoms persist after use of medication, please follow up at Urgent Care or with ophthalmologist (eye doctor)

## 2022-12-19 ENCOUNTER — Emergency Department
Admission: EM | Admit: 2022-12-19 | Discharge: 2022-12-19 | Disposition: A | Discharge status: Home / Self Care | Payer: 59 | Attending: Emergency Medicine | Admitting: Emergency Medicine

## 2022-12-19 ENCOUNTER — Other Ambulatory Visit: Payer: Self-pay

## 2022-12-19 DIAGNOSIS — W57XXXA Bitten or stung by nonvenomous insect and other nonvenomous arthropods, initial encounter: Secondary | ICD-10-CM

## 2022-12-19 DIAGNOSIS — T7840XA Allergy, unspecified, initial encounter: Secondary | ICD-10-CM | POA: Diagnosis not present

## 2022-12-19 DIAGNOSIS — R079 Chest pain, unspecified: Secondary | ICD-10-CM | POA: Diagnosis not present

## 2022-12-19 DIAGNOSIS — S50862A Insect bite (nonvenomous) of left forearm, initial encounter: Secondary | ICD-10-CM | POA: Diagnosis not present

## 2022-12-19 DIAGNOSIS — S59912A Unspecified injury of left forearm, initial encounter: Secondary | ICD-10-CM | POA: Diagnosis present

## 2022-12-19 DIAGNOSIS — L509 Urticaria, unspecified: Secondary | ICD-10-CM

## 2022-12-19 LAB — BASIC METABOLIC PANEL
Anion gap: 13 (ref 5–15)
BUN: 23 mg/dL — ABNORMAL HIGH (ref 6–20)
CO2: 20 mmol/L — ABNORMAL LOW (ref 22–32)
Calcium: 9.4 mg/dL (ref 8.9–10.3)
Chloride: 103 mmol/L (ref 98–111)
Creatinine, Ser: 1.06 mg/dL (ref 0.61–1.24)
GFR, Estimated: >60 mL/min (ref >60)
Glucose, Bld: 114 mg/dL — ABNORMAL HIGH (ref 70–99)
Potassium: 3.8 mmol/L (ref 3.5–5.1)
Sodium: 136 mmol/L (ref 135–145)

## 2022-12-19 LAB — CBC
HCT: 46.2 % (ref 39.0–52.0)
Hemoglobin: 16 g/dL (ref 13.0–17.0)
MCH: 30.1 pg (ref 26.0–34.0)
MCHC: 34.6 g/dL (ref 30.0–36.0)
MCV: 86.8 fL (ref 80.0–100.0)
Platelets: 285 10*3/uL (ref 150–400)
RBC: 5.32 MIL/uL (ref 4.22–5.81)
RDW: 13.2 % (ref 11.5–15.5)
WBC: 8.9 10*3/uL (ref 4.0–10.5)
nRBC: 0 % (ref 0.0–0.2)

## 2022-12-19 LAB — TROPONIN I (HIGH SENSITIVITY): Troponin I (High Sensitivity): 4 ng/L (ref <18)

## 2022-12-19 MED ORDER — PREDNISONE 50 MG PO TABS: 50.0000 mg | ORAL_TABLET | Freq: Every day | ORAL | 0 refills | Status: AC

## 2022-12-19 MED ORDER — EPINEPHRINE 0.3 MG/0.3ML IJ SOAJ: 0.3000 mg | INTRAMUSCULAR | 1 refills | Status: AC | PRN

## 2022-12-19 MED ORDER — PREDNISONE 20 MG PO TABS
60.0000 mg | ORAL_TABLET | Freq: Once | ORAL | Status: AC
  Administered 2022-12-19: 60 mg via ORAL
  Filled 2022-12-19: qty 3

## 2022-12-19 NOTE — Discharge Instructions (Addendum)
Please use Benadryl 25-50 mg every 8 hours for the next 3 days

## 2022-12-19 NOTE — ED Provider Notes (Signed)
[facility] Provider Note   Event Date/Time   First MD Initiated Contact with Patient 12/19/22 1106     (approximate) History  Insect Bite and Chest Pain  HPI Nathan Hayes is a 52 y.o. male who presents after multiple fire ant bites to the left forearm complaining of hives, chest pain, and shortness of breath that have since resolved after taking 50 mg of Benadryl prior to arrival.  Patient denies any known history of significant ant allergies.  Patient currently denies any shortness of breath, difficulty swallowing or breathing.  Patient does endorse continued rash over bilateral upper lower extremities as well as across the stomach ROS: Patient currently denies any vision changes, tinnitus, difficulty speaking, facial droop, sore throat, chest pain, shortness of breath, abdominal pain, nausea/vomiting/diarrhea, dysuria, or weakness/numbness/paresthesias in any extremity   Physical Exam  Triage Vital Signs: ED Triage Vitals  Encounter Vitals Group     BP 12/19/22 1020 (!) 140/72     Systolic BP Percentile --      Diastolic BP Percentile --      Pulse Rate 12/19/22 1020 93     Resp 12/19/22 1018 18     Temp 12/19/22 1020 97.7 F (36.5 C)     Temp src --      SpO2 12/19/22 1020 98 %     Weight 12/19/22 1019 280 lb (127 kg)     Height 12/19/22 1019 6' (1.829 m)     Head Circumference --      Peak Flow --      Pain Score --      Pain Loc --      Pain Education --      Exclude from Growth Chart --    Most recent vital signs: Vitals:   12/19/22 1018 12/19/22 1020  BP:  (!) 140/72  Pulse:  93  Resp: 18   Temp:  97.7 F (36.5 C)  SpO2:  98%   General: Awake, oriented x4. CV:  Good peripheral perfusion.  Resp:  Normal effort.  Abd:  No distention.  Other:  Middle-aged obese Caucasian male resting comfortably in no acute distress ED Results / Procedures / Treatments  Labs (all labs ordered are listed, but only abnormal results are displayed) Labs  Reviewed  BASIC METABOLIC PANEL - Abnormal; Notable for the following components:      Result Value   CO2 20 (*)    Glucose, Bld 114 (*)    BUN 23 (*)    All other components within normal limits  CBC  TROPONIN I (HIGH SENSITIVITY)   EKG ED ECG REPORT I, Merwyn Katos, the attending physician, personally viewed and interpreted this ECG. Date: 12/19/2022 EKG Time: 1023 Rate: 83 Rhythm: normal sinus rhythm QRS Axis: normal Intervals: normal ST/T Wave abnormalities: normal Narrative Interpretation: no evidence of acute ischemia PROCEDURES: Critical Care performed: No Procedures MEDICATIONS ORDERED IN ED: Medications  predniSONE (DELTASONE) tablet 60 mg (has no administration in time range)   IMPRESSION / MDM / ASSESSMENT AND PLAN / ED COURSE  I reviewed the triage vital signs and the nursing notes.                             The patient is on the cardiac monitor to evaluate for evidence of arrhythmia and/or significant heart rate changes. Patient's presentation is most consistent with acute presentation with potential threat to life or bodily function. +  Cutaneous hives/erythema No evidence of multiorgan involvement  Given history and exam, presentation most consistent with allergic reaction. I have low suspicion for toxic shock syndrome, anaphylaxis, asthma exacerbation, or drug toxicity. Rx: Prednisone 50mg  qday x3days, Benadryl 25mg  q8hr x3days Disposition: Discharge home with SRP. Follow up with PCP in 1-2 days.   FINAL CLINICAL IMPRESSION(S) / ED DIAGNOSES   Final diagnoses:  Insect bite of left forearm, initial encounter  Urticaria  Allergic reaction, initial encounter   Rx / DC Orders   ED Discharge Orders          Ordered    predniSONE (DELTASONE) 50 MG tablet  Daily with breakfast        12/19/22 1117    EPINEPHrine (EPIPEN 2-PAK) 0.3 mg/0.3 mL IJ SOAJ injection  As needed        12/19/22 1118           Note:  This document was prepared using  Dragon voice recognition software and may include unintentional dictation errors.   Merwyn Katos, MD 12/19/22 [id number]

## 2022-12-19 NOTE — ED Triage Notes (Signed)
Pt to ED for fire ant bites today to arms and legs. Took 50mg  benadryl PTA.  Reports was having chest pain, has since subsided

## 2023-07-12 ENCOUNTER — Ambulatory Visit (INDEPENDENT_AMBULATORY_CARE_PROVIDER_SITE_OTHER)

## 2023-07-12 ENCOUNTER — Ambulatory Visit: Admission: EM | Admit: 2023-07-12 | Discharge: 2023-07-12 | Disposition: A | Discharge status: Home / Self Care

## 2023-07-12 DIAGNOSIS — R5383 Other fatigue: Secondary | ICD-10-CM | POA: Diagnosis not present

## 2023-07-12 DIAGNOSIS — Z2089 Contact with and (suspected) exposure to other communicable diseases: Secondary | ICD-10-CM

## 2023-07-12 DIAGNOSIS — R058 Other specified cough: Secondary | ICD-10-CM | POA: Diagnosis not present

## 2023-07-12 DIAGNOSIS — B349 Viral infection, unspecified: Secondary | ICD-10-CM

## 2023-07-12 MED ORDER — IPRATROPIUM-ALBUTEROL 0.5-2.5 (3) MG/3ML IN SOLN: 3.0000 mL | Freq: Once | RESPIRATORY_TRACT | Status: DC

## 2023-07-12 MED ORDER — PROMETHAZINE-DM 6.25-15 MG/5ML PO SYRP: 5.0000 mL | ORAL_SOLUTION | Freq: Four times a day (QID) | ORAL | 0 refills | Status: AC | PRN

## 2023-07-12 MED ORDER — IPRATROPIUM BROMIDE 0.06 % NA SOLN: 2.0000 | Freq: Four times a day (QID) | NASAL | 0 refills | Status: AC

## 2023-07-12 NOTE — ED Provider Notes (Addendum)
 [facility]    CSN: [id number] Arrival date & time: 07/12/23  4098      History   Chief Complaint Chief Complaint  Patient presents with   Cough   Ear Fullness   Nasal Congestion    HPI Nathan Hayes is a 53 y.o. male presenting for 6-day history of cough productive of brownish-green sputum, fatigue, bilateral ear pressure/fullness, nasal congestion.  Denies fever, body aches, sinus pain, sore throat, chest pain, wheezing, shortness of breath.  Reports that his wife was seen last week and received antibiotics for pneumonia.  He believes he might have pneumonia as well.  He has been taking over-the-counter medication but says he thinks he needs something stronger.  No history of COPD or asthma and patient is a non-smoker.  HPI  Past Medical History:  Diagnosis Date   Hypertension     Patient Active Problem List   Diagnosis Date Noted   Chest pain 04/26/2015    Past Surgical History:  Procedure Laterality Date   APPENDECTOMY     CARDIAC CATHETERIZATION     KNEE SURGERY Right    Orthoscopic Rt knee surgery 2005-2006 approximately per pt       Home Medications    Prior to Admission medications   Medication Sig Start Date End Date Taking? Authorizing Provider  aspirin EC 81 MG tablet Take 81-162 mg by mouth daily.   Yes [provider]  buPROPion (WELLBUTRIN XL) 150 MG 24 hr tablet Take 150 mg by mouth daily.   Yes [provider]  calcium carbonate (TUMS - DOSED IN MG ELEMENTAL CALCIUM) 500 MG chewable tablet Chew 1 tablet by mouth 3 (three) times daily.   Yes [provider]  diazepam (VALIUM) 2 MG tablet Take by mouth.   Yes [provider]  EPINEPHrine (EPIPEN 2-PAK) 0.3 mg/0.3 mL IJ SOAJ injection Inject 0.3 mg into the muscle as needed for anaphylaxis. 12/19/22  Yes Merwyn Katos, MD  escitalopram (LEXAPRO) 20 MG tablet Take 20 mg by mouth daily.   Yes [provider]  ipratropium (ATROVENT) 0.06 % nasal  spray Place 2 sprays into both nostrils 4 (four) times daily. 07/12/23  Yes Eusebio Friendly B, PA-C  lisinopril (PRINIVIL,ZESTRIL) 10 MG tablet Take 10 mg by mouth daily.   Yes [provider]  lisinopril-hydrochlorothiazide (ZESTORETIC) 20-12.5 MG tablet Take 1 tablet by mouth daily.   Yes [provider]  metoprolol tartrate (LOPRESSOR) 25 MG tablet Take 0.5 tablets (12.5 mg total) by mouth 2 (two) times daily. 04/27/15  Yes Gouru, Deanna Artis, MD  promethazine-dextromethorphan (PROMETHAZINE-DM) 6.25-15 MG/5ML syrup Take 5 mLs by mouth 4 (four) times daily as needed. 07/12/23  Yes Eusebio Friendly B, PA-C  trimethoprim-polymyxin b (POLYTRIM) ophthalmic solution Place 1 drop into both eyes every 4 (four) hours. 03/03/21  Yes Valinda Hoar, NP    Family History History reviewed. No pertinent family history.  Social History Social History   Tobacco Use   Smoking status: Never   Smokeless tobacco: Never  Substance Use Topics   Alcohol use: Yes    Comment: occasional   Drug use: No     Allergies   Patient has no known allergies.   Review of Systems Review of Systems  Constitutional:  Positive for fatigue. Negative for fever.  HENT:  Positive for congestion and rhinorrhea. Negative for ear discharge, ear pain, hearing loss, sinus pressure, sinus pain and sore throat.   Respiratory:  Positive for cough. Negative for shortness of  breath and wheezing.   Cardiovascular:  Negative for chest pain.  Gastrointestinal:  Negative for abdominal pain, diarrhea, nausea and vomiting.  Musculoskeletal:  Negative for myalgias.  Neurological:  Negative for weakness, light-headedness and headaches.  Hematological:  Negative for adenopathy.     Physical Exam Triage Vital Signs ED Triage Vitals  Encounter Vitals Group     BP      Systolic BP Percentile      Diastolic BP Percentile      Pulse      Resp      Temp      Temp src      SpO2      Weight      Height      Head  Circumference      Peak Flow      Pain Score      Pain Loc      Pain Education      Exclude from Growth Chart    No data found.  Updated Vital Signs BP 130/77 (BP Location: Left Arm)   Pulse 86   Temp 99.5 F (37.5 C) (Oral)   Resp 17   SpO2 100%     Physical Exam Vitals and nursing note reviewed.  Constitutional:      General: He is not in acute distress.    Appearance: Normal appearance. He is well-developed. He is not ill-appearing.  HENT:     Head: Normocephalic and atraumatic.     Right Ear: Tympanic membrane, ear canal and external ear normal.     Left Ear: Tympanic membrane, ear canal and external ear normal.     Nose: Congestion present.     Mouth/Throat:     Mouth: Mucous membranes are moist.     Pharynx: Oropharynx is clear.  Eyes:     General: No scleral icterus.    Conjunctiva/sclera: Conjunctivae normal.  Cardiovascular:     Rate and Rhythm: Normal rate and regular rhythm.     Heart sounds: Normal heart sounds.  Pulmonary:     Effort: Pulmonary effort is normal. No respiratory distress.     Breath sounds: Normal breath sounds.  Musculoskeletal:     Cervical back: Neck supple.  Skin:    General: Skin is warm and dry.     Capillary Refill: Capillary refill takes less than 2 seconds.  Neurological:     General: No focal deficit present.     Mental Status: He is alert. Mental status is at baseline.     Motor: No weakness.     Gait: Gait normal.  Psychiatric:        Mood and Affect: Mood normal.        Behavior: Behavior normal.      UC Treatments / Results  Labs (all labs ordered are listed, but only abnormal results are displayed) Labs Reviewed - No data to display  EKG   Radiology DG Chest 2 View Result Date: 07/12/2023 CLINICAL DATA:  Cough and congestion. EXAM: CHEST - 2 VIEW COMPARISON:  Chest radiograph dated 04/26/2015. FINDINGS: The heart size and mediastinal contours are within normal limits. Both lungs are clear. The visualized  skeletal structures are unremarkable. IMPRESSION: No active cardiopulmonary disease. Electronically Signed   By: Elgie Collard M.D.   On: 07/12/2023 11:05    Procedures Procedures (including critical care time)  Medications Ordered in UC Medications - No data to display  Initial Impression / Assessment and Plan / UC Course  I have  reviewed the triage vital signs and the nursing notes.  Pertinent labs & imaging results that were available during my care of the patient were reviewed by me and considered in my medical decision making (see chart for details).   53 year old male presents for 6 day history of productive cough, fatigue, nasal congestion, bilateral ear pressure.  Reports his wife is diagnosed with pneumonia.  He has not had a fever or any breathing difficulty.    His vitals are all normal and stable and he is overall well-appearing.  No acute distress.  On exam has nasal congestion.  No evidence of ear infection.  Throat clear.  Chest clear to auscultation in 4 lung fields.  Chest x-ray obtained given patient's fears about possible pneumonia.  X-ray wet read negative.  Advised I would contact him and send antibiotics if the overread is positive for pneumonia.  In the meantime I will send Promethazine DM to pharmacy to help with cough.  Also sent Atrovent nasal spray.  Encouraged increasing rest and fluids.  Explained the symptoms are most likely viral especially if the overread is negative.  Symptoms can last for a couple of weeks.  He should return if he develops a fever, sinus pain, chest pain or breathing problem.  Patient is understanding and agreeable.  X-ray over read negative.   Final Clinical Impressions(s) / UC Diagnoses   Final diagnoses:  Productive cough  Exposure to pneumonia  Other fatigue  Viral illness     Discharge Instructions      -Will call if your x-ray shows pneumonia and send antibiotics. - Sent cough medicine nasal spray. - If x-ray is clear  for pneumonia then your symptoms are more consistent with a viral illness and can take a few weeks to resolve.  Increase rest and fluids. - Return if fever, worsening cough, increased chest pain, shortness of breath or weakness.     ED Prescriptions     Medication Sig Dispense Auth. Provider   promethazine-dextromethorphan (PROMETHAZINE-DM) 6.25-15 MG/5ML syrup Take 5 mLs by mouth 4 (four) times daily as needed. 118 mL Eusebio Friendly B, PA-C   ipratropium (ATROVENT) 0.06 % nasal spray Place 2 sprays into both nostrils 4 (four) times daily. 15 mL Shirlee Latch, PA-C      PDMP not reviewed this encounter.    Shirlee Latch, PA-C 07/12/23 1151

## 2023-07-12 NOTE — ED Triage Notes (Signed)
 Sx since Thursday  Nasal and chest congestion Productive cough with brown mucus Bilateral ear fullness No fever   Wife is being treated for pneumonia

## 2023-07-12 NOTE — Discharge Instructions (Addendum)
-  Will call if your x-ray shows pneumonia and send antibiotics. - Sent cough medicine nasal spray. - If x-ray is clear for pneumonia then your symptoms are more consistent with a viral illness and can take a few weeks to resolve.  Increase rest and fluids. - Return if fever, worsening cough, increased chest pain, shortness of breath or weakness.

## 2024-05-08 ENCOUNTER — Encounter
Admission: RE | Disposition: A | Discharge status: Home / Self Care | Payer: Self-pay | Source: Home / Self Care | Attending: Gastroenterology

## 2024-05-08 ENCOUNTER — Other Ambulatory Visit: Payer: Self-pay

## 2024-05-08 ENCOUNTER — Ambulatory Visit: Admitting: Certified Registered"

## 2024-05-08 ENCOUNTER — Encounter: Payer: Self-pay | Admitting: Gastroenterology

## 2024-05-08 ENCOUNTER — Ambulatory Visit
Admission: RE | Admit: 2024-05-08 | Discharge: 2024-05-08 | Disposition: A | Discharge status: Home / Self Care | Attending: Gastroenterology | Admitting: Gastroenterology

## 2024-05-08 DIAGNOSIS — Z1211 Encounter for screening for malignant neoplasm of colon: Secondary | ICD-10-CM | POA: Diagnosis present

## 2024-05-08 DIAGNOSIS — D124 Benign neoplasm of descending colon: Secondary | ICD-10-CM | POA: Diagnosis not present

## 2024-05-08 DIAGNOSIS — D122 Benign neoplasm of ascending colon: Secondary | ICD-10-CM | POA: Diagnosis not present

## 2024-05-08 DIAGNOSIS — I10 Essential (primary) hypertension: Secondary | ICD-10-CM | POA: Insufficient documentation

## 2024-05-08 HISTORY — DX: Sleep apnea, unspecified: G47.30

## 2024-05-08 HISTORY — PX: COLONOSCOPY: SHX5424

## 2024-05-08 HISTORY — PX: POLYPECTOMY: SHX149

## 2024-05-08 MED ORDER — LIDOCAINE HCL (PF) 2 % IJ SOLN
INTRAMUSCULAR | Status: DC | PRN
  Administered 2024-05-08: 40 mg via INTRADERMAL

## 2024-05-08 MED ORDER — PROPOFOL 1000 MG/100ML IV EMUL
INTRAVENOUS | Status: AC
  Filled 2024-05-08: qty 100

## 2024-05-08 MED ORDER — PROPOFOL 10 MG/ML IV BOLUS
INTRAVENOUS | Status: DC | PRN
  Administered 2024-05-08: 40 mg via INTRAVENOUS
  Administered 2024-05-08 (×3): 20 mg via INTRAVENOUS
  Administered 2024-05-08: 40 mg via INTRAVENOUS
  Administered 2024-05-08 (×3): 20 mg via INTRAVENOUS

## 2024-05-08 MED ORDER — SODIUM CHLORIDE 0.9 % IV SOLN: INTRAVENOUS | Status: DC

## 2024-05-08 MED ORDER — LIDOCAINE HCL (PF) 2 % IJ SOLN
INTRAMUSCULAR | Status: AC
  Filled 2024-05-08: qty 5

## 2024-05-08 NOTE — Op Note (Signed)
 [facility] Patient Name: Nathan Hayes Procedure Date: 05/08/2024 10:49 AM MRN: [id number] Account #: [id number] Date of Birth: [birth year] Admit Type: Outpatient Age: 54 Room: [facility] ENDO ROOM 3 Gender: Male Note Status: Finalized Instrument Name: Colon Scope [id number] Procedure:             Colonoscopy Indications:           Screening for colorectal malignant neoplasm, This is                         the patient's first colonoscopy Providers:             Corinn Jess Brooklyn MD, MD Referring MD:          Cheryl CHARLENA Jericho (Referring MD) Medicines:             General Anesthesia Complications:         No immediate complications. Estimated blood loss: None. Procedure:             Pre-Anesthesia Assessment:                        - Prior to the procedure, a History and Physical was                         performed, and patient medications and allergies were                         reviewed. The patient is competent. The risks and                         benefits of the procedure and the sedation options and                         risks were discussed with the patient. All questions                         were answered and informed consent was obtained.                         Patient identification and proposed procedure were                         verified by the physician, the nurse, the                         anesthesiologist, the anesthetist and the technician                         in the pre-procedure area in the procedure room in the                         endoscopy suite. Mental Status Examination: alert and                         oriented. Airway Examination: normal oropharyngeal                         airway and neck mobility. Respiratory Examination:  clear to auscultation. CV Examination: normal.                         Prophylactic Antibiotics: The patient does not require                         prophylactic  antibiotics. Prior Anticoagulants: The                         patient has taken no anticoagulant or antiplatelet                         agents. ASA Grade Assessment: III - A patient with                         severe systemic disease. After reviewing the risks and                         benefits, the patient was deemed in satisfactory                         condition to undergo the procedure. The anesthesia                         plan was to use general anesthesia. Immediately prior                         to administration of medications, the patient was                         re-assessed for adequacy to receive sedatives. The                         heart rate, respiratory rate, oxygen saturations,                         blood pressure, adequacy of pulmonary ventilation, and                         response to care were monitored throughout the                         procedure. The physical status of the patient was                         re-assessed after the procedure.                        After obtaining informed consent, the colonoscope was                         passed under direct vision. Throughout the procedure,                         the patient's blood pressure, pulse, and oxygen                         saturations were monitored continuously. The  Colonoscope was introduced through the anus and                         advanced to the the cecum, identified by appendiceal                         orifice and ileocecal valve. The colonoscopy was                         performed without difficulty. The patient tolerated                         the procedure well. The quality of the bowel                         preparation was evaluated using the BBPS [facility] Bowel                         Preparation Scale) with scores of: Right Colon = 3,                         Transverse Colon = 3 and Left Colon = 3 (entire mucosa                         seen  well with no residual staining, small fragments                         of stool or opaque liquid). The total BBPS score                         equals 9. The ileocecal valve, appendiceal orifice,                         and rectum were photographed. Findings:      The perianal and digital rectal examinations were normal. Pertinent       negatives include normal sphincter tone and no palpable rectal lesions.      Two sessile polyps were found in the descending colon and ascending       colon. The polyps were 5 mm in size. These polyps were removed with a       cold snare. Resection and retrieval were complete. Estimated blood loss:       none.      The retroflexed view of the distal rectum and anal verge was normal and       showed no anal or rectal abnormalities. Impression:            - Two 5 mm polyps in the descending colon and in the                         ascending colon, removed with a cold snare. Resected                         and retrieved.                        - The distal rectum and anal verge are normal on  retroflexion view. Recommendation:        - Discharge patient to home (with escort).                        - Resume previous diet today.                        - Continue present medications.                        - Await pathology results.                        - Repeat colonoscopy in 5 to 7 years for surveillance                         based on pathology results. Procedure Code(s):     --- Professional ---                        [phone], Colonoscopy, flexible; with removal of                         tumor(s), polyp(s), or other lesion(s) by snare                         technique Diagnosis Code(s):     --- Professional ---                        Z12.11, Encounter for screening for malignant neoplasm                         of colon                        D12.4, Benign neoplasm of descending colon                        D12.2, Benign  neoplasm of ascending colon CPT copyright 2022 American Medical Association. All rights reserved. The codes documented in this report are preliminary and upon coder review may  be revised to meet current compliance requirements. Dr. [name] Corinn Jess Brooklyn MD, MD 05/08/2024 11:20:17 AM This report has been signed electronically. Number of Addenda: 0 Note Initiated On: 05/08/2024 10:49 AM Scope Withdrawal Time: 0 hours 13 minutes 28 seconds  Total Procedure Duration: 0 hours 15 minutes 51 seconds  Estimated Blood Loss:  Estimated blood loss: none.      [facility]

## 2024-05-08 NOTE — Transfer of Care (Signed)
 Immediate Anesthesia Transfer of Care Note  Patient: Nathan Hayes  Procedure(s) Performed: COLONOSCOPY POLYPECTOMY, INTESTINE  Patient Location: PACU and Endoscopy Unit  Anesthesia Type:MAC  Level of Consciousness: awake and drowsy  Airway & Oxygen Therapy: Patient Spontanous Breathing and Patient connected to nasal cannula oxygen  Post-op Assessment: Report given to RN and Post -op Vital signs reviewed and stable  Post vital signs: Reviewed and stable  Last Vitals:  Vitals Value Taken Time  BP 85/65 05/08/24 11:22  Temp    Pulse 69 05/08/24 11:22  Resp 15 05/08/24 11:23  SpO2 99 % 05/08/24 11:22  Vitals shown include unfiled device data.  Last Pain:  Vitals:   05/08/24 1031  TempSrc: Temporal  PainSc: 0-No pain         Complications: No notable events documented.

## 2024-05-08 NOTE — Anesthesia Postprocedure Evaluation (Signed)
"   Anesthesia Post Note  Patient: Nathan Hayes  Procedure(s) Performed: COLONOSCOPY POLYPECTOMY, INTESTINE  Patient location during evaluation: Endoscopy Anesthesia Type: General Level of consciousness: awake and alert Pain management: pain level controlled Vital Signs Assessment: post-procedure vital signs reviewed and stable Respiratory status: spontaneous breathing, nonlabored ventilation, respiratory function stable and patient connected to nasal cannula oxygen Cardiovascular status: blood pressure returned to baseline and stable Postop Assessment: no apparent nausea or vomiting Anesthetic complications: no   No notable events documented.   Last Vitals:  Vitals:   05/08/24 1123 05/08/24 1133  BP: (!) 93/57 120/75  Pulse: 69 73  Resp: 15 16  Temp: (!) 36.1 C   SpO2: 99% 97%    Last Pain:  Vitals:   05/08/24 1133  TempSrc:   PainSc: 0-No pain                 Fairy A Sumit Branham      "

## 2024-05-08 NOTE — H&P (Signed)
 "   [name], MD [facility], [address]  [place], [place] [address]  Main: [phone] Fax:  [phone] Pager: [phone]   Primary Care Physician:  Jeffie Cheryl BRAVO, MD Primary Gastroenterologist:  Dr. [name]  Pre-Procedure History & Physical: HPI:  Nathan Hayes is a 54 y.o. male is here for an colonoscopy.   Past Medical History:  Diagnosis Date   Hypertension    Sleep apnea     Past Surgical History:  Procedure Laterality Date   APPENDECTOMY     CARDIAC CATHETERIZATION     KNEE SURGERY Right    Orthoscopic Rt knee surgery 2005-2006 approximately per pt    Prior to Admission medications  Medication Sig Start Date End Date Taking? Authorizing Provider  aspirin  EC 81 MG tablet Take 81-162 mg by mouth daily.   Yes [provider]  buPROPion (WELLBUTRIN XL) 150 MG 24 hr tablet Take 150 mg by mouth daily.   Yes [provider]  calcium carbonate (TUMS - DOSED IN MG ELEMENTAL CALCIUM) 500 MG chewable tablet Chew 1 tablet by mouth 3 (three) times daily.   Yes [provider]  EPINEPHrine  (EPIPEN  2-PAK) 0.3 mg/0.3 mL IJ SOAJ injection Inject 0.3 mg into the muscle as needed for anaphylaxis. 12/19/22  Yes Bradler, Evan K, MD  escitalopram (LEXAPRO) 20 MG tablet Take 20 mg by mouth daily.   Yes [provider]  lisinopril -hydrochlorothiazide (ZESTORETIC) 20-12.5 MG tablet Take 1 tablet by mouth daily.   Yes [provider]  metoprolol  tartrate (LOPRESSOR ) 25 MG tablet Take 0.5 tablets (12.5 mg total) by mouth 2 (two) times daily. 04/27/15  Yes Gouru, Aruna, MD  diazepam (VALIUM) 2 MG tablet Take by mouth.    [provider]  ipratropium (ATROVENT ) 0.06 % nasal spray Place 2 sprays into both nostrils 4 (four) times daily. 07/12/23   Arvis Jolan NOVAK, PA-C  lisinopril  (PRINIVIL ,ZESTRIL ) 10 MG tablet Take 10 mg by mouth daily.    [provider]   promethazine -dextromethorphan (PROMETHAZINE -DM) 6.25-15 MG/5ML syrup Take 5 mLs by mouth 4 (four) times daily as needed. 07/12/23   Arvis Jolan B, PA-C  trimethoprim -polymyxin b  (POLYTRIM ) ophthalmic solution Place 1 drop into both eyes every 4 (four) hours. 03/03/21   Teresa Shelba JONELLE, NP    Allergies as of 04/29/2024   (No Known Allergies)    History reviewed. No pertinent family history.  Social History   Socioeconomic History   Marital status: Married    Spouse name: Not on file   Number of children: Not on file   Years of education: Not on file   Highest education level: Not on file  Occupational History   Not on file  Tobacco Use   Smoking status: Never   Smokeless tobacco: Never  Vaping Use   Vaping status: Not on file  Substance and Sexual Activity   Alcohol use: Yes    Comment: occasional   Drug use: No   Sexual activity: Yes  Other Topics Concern   Not on file  Social History Narrative   Not on file   Social Drivers of Health   Tobacco Use: Low Risk (05/08/2024)   Patient History    Smoking Tobacco Use: Never    Smokeless Tobacco Use: Never    Passive Exposure: Not on file  Financial Resource Strain: Low Risk  (02/11/2024)   Received from [facility]   Overall Financial Resource Strain (CARDIA)    Difficulty  of Paying Living Expenses: Not hard at all  Food Insecurity: No Food Insecurity (02/11/2024)   Received from [facility]   Epic    Within the past 12 months, you worried that your food would run out before you got the money to buy more.: Never true    Within the past 12 months, the food you bought just didn't last and you didn't have money to get more.: Never true  Transportation Needs: No Transportation Needs (02/11/2024)   Received from [facility] - Transportation    In the past 12 months, has lack of transportation kept you from medical appointments or from getting medications?:  No    Lack of Transportation (Non-Medical): No  Physical Activity: Not on file  Stress: Not on file  Social Connections: Not on file  Intimate Partner Violence: Not on file  Depression (EYV7-0): Not on file  Alcohol Screen: Not on file  Housing: Low Risk  (02/11/2024)   Received from [facility]   Epic    In the last 12 months, was there a time when you were not able to pay the mortgage or rent on time?: No    In the past 12 months, how many times have you moved where you were living?: 0    At any time in the past 12 months, were you homeless or living in a shelter (including now)?: No  Utilities: Not At Risk (02/11/2024)   Received from [facility]   Epic    In the past 12 months has the electric, gas, oil, or water company threatened to shut off services in your home?: No  Health Literacy: Not on file    Review of Systems: See HPI, otherwise negative ROS  Physical Exam: BP (!) 93/57   Pulse 69   Temp (!) 97 F (36.1 C) (Temporal)   Resp 15   Ht 5' 11 (1.803 m)   Wt 132.1 kg   SpO2 99%   BMI 40.61 kg/m  General:   Alert,  pleasant and cooperative in NAD Head:  Normocephalic and atraumatic. Neck:  Supple; no masses or thyromegaly. Lungs:  Clear throughout to auscultation.    Heart:  Regular rate and rhythm. Abdomen:  Soft, nontender and nondistended. Normal bowel sounds, without guarding, and without rebound.   Neurologic:  Alert and  oriented x4;  grossly normal neurologically.  Impression/Plan: [name] is here for an colonoscopy to be performed for colon cancer screening  Risks, benefits, limitations, and alternatives regarding  colonoscopy have been reviewed with the patient.  Questions have been answered.  All parties agreeable.   [name], MD  05/08/2024, 11:31 AM "

## 2024-05-08 NOTE — Anesthesia Preprocedure Evaluation (Signed)
"                                    Anesthesia Evaluation  Patient identified by MRN, date of birth, ID band Patient awake    Reviewed: Allergy & Precautions, H&P , NPO status , Patient's Chart, lab work & pertinent test results  Airway Mallampati: II  TM Distance: >3 FB Neck ROM: Full    Dental no notable dental hx. (+) Teeth Intact   Pulmonary neg pulmonary ROS   Pulmonary exam normal breath sounds clear to auscultation       Cardiovascular hypertension, negative cardio ROS Normal cardiovascular exam Rhythm:Regular Rate:Normal     Neuro/Psych negative neurological ROS  negative psych ROS   GI/Hepatic negative GI ROS, Neg liver ROS,,,  Endo/Other  negative endocrine ROS    Renal/GU negative Renal ROS  negative genitourinary   Musculoskeletal negative musculoskeletal ROS (+)    Abdominal   Peds negative pediatric ROS (+)  Hematology negative hematology ROS (+)   Anesthesia Other Findings   Reproductive/Obstetrics negative OB ROS                              Anesthesia Physical Anesthesia Plan  ASA: 3  Anesthesia Plan: General   Post-op Pain Management:    Induction: Intravenous  PONV Risk Score and Plan:   Airway Management Planned:   Additional Equipment:   Intra-op Plan:   Post-operative Plan: Extubation in OR  Informed Consent: I have reviewed the patients History and Physical, chart, labs and discussed the procedure including the risks, benefits and alternatives for the proposed anesthesia with the patient or authorized representative who has indicated his/her understanding and acceptance.     Dental advisory given  Plan Discussed with: CRNA  Anesthesia Plan Comments:         Anesthesia Quick Evaluation  "

## 2024-05-09 LAB — SURGICAL PATHOLOGY

## 2024-05-13 ENCOUNTER — Ambulatory Visit: Payer: Self-pay | Admitting: Gastroenterology
